# Patient Record
Sex: Female | Born: 1970 | Race: White | Hispanic: No | Marital: Married | Smoking: Never smoker
Health system: Southern US, Community
[De-identification: ages and names within clinical notes are randomized; demographics above are authoritative.]

## PROBLEM LIST (undated history)

## (undated) DIAGNOSIS — E669 Obesity, unspecified: Secondary | ICD-10-CM

## (undated) DIAGNOSIS — K219 Gastro-esophageal reflux disease without esophagitis: Secondary | ICD-10-CM

## (undated) DIAGNOSIS — N393 Stress incontinence (female) (male): Secondary | ICD-10-CM

## (undated) DIAGNOSIS — G510 Bell's palsy: Secondary | ICD-10-CM

## (undated) DIAGNOSIS — F410 Panic disorder [episodic paroxysmal anxiety] without agoraphobia: Secondary | ICD-10-CM

## (undated) DIAGNOSIS — K625 Hemorrhage of anus and rectum: Secondary | ICD-10-CM

## (undated) DIAGNOSIS — B370 Candidal stomatitis: Secondary | ICD-10-CM

## (undated) HISTORY — DX: Bell's palsy: G51.0

## (undated) HISTORY — DX: Gastro-esophageal reflux disease without esophagitis: K21.9

## (undated) HISTORY — DX: Obesity, unspecified: E66.9

## (undated) HISTORY — DX: Hemorrhage of anus and rectum: K62.5

## (undated) HISTORY — DX: Stress incontinence (female) (male): N39.3

## (undated) HISTORY — DX: Panic disorder (episodic paroxysmal anxiety): F41.0

## (undated) HISTORY — DX: Candidal stomatitis: B37.0

---

## 2006-10-12 ENCOUNTER — Inpatient Hospital Stay (HOSPITAL_COMMUNITY): Admission: RE | Admit: 2006-10-12 | Discharge: 2006-10-15 | Payer: Self-pay | Admitting: Obstetrics and Gynecology

## 2007-11-23 LAB — CONVERTED CEMR LAB: Pap Smear: NORMAL

## 2008-04-09 ENCOUNTER — Ambulatory Visit: Payer: Self-pay | Admitting: *Deleted

## 2008-04-09 DIAGNOSIS — E669 Obesity, unspecified: Secondary | ICD-10-CM | POA: Insufficient documentation

## 2008-04-09 DIAGNOSIS — K219 Gastro-esophageal reflux disease without esophagitis: Secondary | ICD-10-CM | POA: Insufficient documentation

## 2008-04-09 DIAGNOSIS — R32 Unspecified urinary incontinence: Secondary | ICD-10-CM | POA: Insufficient documentation

## 2008-04-09 DIAGNOSIS — J301 Allergic rhinitis due to pollen: Secondary | ICD-10-CM

## 2008-04-09 LAB — CONVERTED CEMR LAB
ALT: 24 units/L (ref 0–35)
Alkaline Phosphatase: 83 units/L (ref 39–117)
BUN: 12 mg/dL (ref 6–23)
Basophils Absolute: 0 10*3/uL (ref 0.0–0.1)
Basophils Relative: 0 % (ref 0.0–3.0)
CO2: 24 meq/L (ref 19–32)
Calcium: 9.4 mg/dL (ref 8.4–10.5)
Chloride: 105 meq/L (ref 96–112)
Creatinine, Ser: 0.6 mg/dL (ref 0.4–1.2)
Eosinophils Absolute: 0.1 10*3/uL (ref 0.0–0.7)
Eosinophils Relative: 0.9 % (ref 0.0–5.0)
Glucose, Bld: 94 mg/dL (ref 70–99)
HCT: 40.4 % (ref 36.0–46.0)
Hemoglobin: 13.9 g/dL (ref 12.0–15.0)
LDL Cholesterol: 87 mg/dL (ref 0–99)
Lymphocytes Relative: 27.5 % (ref 12.0–46.0)
MCHC: 34.4 g/dL (ref 30.0–36.0)
Neutro Abs: 3.6 10*3/uL (ref 1.4–7.7)
Neutrophils Relative %: 62.3 % (ref 43.0–77.0)
Potassium: 4.2 meq/L (ref 3.5–5.1)
RDW: 11.9 % (ref 11.5–14.6)
Sodium: 138 meq/L (ref 135–145)
TSH: 1.59 microintl units/mL (ref 0.35–5.50)
Triglycerides: 137 mg/dL (ref 0–149)

## 2008-05-30 ENCOUNTER — Ambulatory Visit: Payer: Self-pay | Admitting: *Deleted

## 2008-07-24 ENCOUNTER — Ambulatory Visit: Payer: Self-pay | Admitting: Family Medicine

## 2008-07-24 DIAGNOSIS — J209 Acute bronchitis, unspecified: Secondary | ICD-10-CM

## 2008-12-21 ENCOUNTER — Ambulatory Visit (HOSPITAL_BASED_OUTPATIENT_CLINIC_OR_DEPARTMENT_OTHER): Admission: RE | Admit: 2008-12-21 | Discharge: 2008-12-21 | Payer: Self-pay | Admitting: Internal Medicine

## 2008-12-21 ENCOUNTER — Telehealth: Payer: Self-pay | Admitting: Family

## 2008-12-21 ENCOUNTER — Ambulatory Visit: Payer: Self-pay | Admitting: Radiology

## 2008-12-21 ENCOUNTER — Ambulatory Visit: Payer: Self-pay | Admitting: Family

## 2008-12-21 DIAGNOSIS — E860 Dehydration: Secondary | ICD-10-CM

## 2008-12-21 DIAGNOSIS — J189 Pneumonia, unspecified organism: Secondary | ICD-10-CM

## 2008-12-21 DIAGNOSIS — B9789 Other viral agents as the cause of diseases classified elsewhere: Secondary | ICD-10-CM

## 2008-12-21 LAB — CONVERTED CEMR LAB
BUN: 10 mg/dL (ref 6–23)
CO2: 23 meq/L (ref 19–32)
Calcium: 9 mg/dL (ref 8.4–10.5)
Chloride: 101 meq/L (ref 96–112)
Creatinine, Ser: 0.87 mg/dL (ref 0.40–1.20)
Glucose, Bld: 113 mg/dL — ABNORMAL HIGH (ref 70–99)
Potassium: 4 meq/L (ref 3.5–5.3)
Sodium: 135 meq/L (ref 135–145)

## 2008-12-22 ENCOUNTER — Telehealth: Payer: Self-pay | Admitting: Family Medicine

## 2008-12-24 ENCOUNTER — Telehealth: Payer: Self-pay | Admitting: Family

## 2008-12-27 ENCOUNTER — Telehealth: Payer: Self-pay | Admitting: Family

## 2009-01-03 ENCOUNTER — Emergency Department (HOSPITAL_BASED_OUTPATIENT_CLINIC_OR_DEPARTMENT_OTHER): Admission: EM | Admit: 2009-01-03 | Discharge: 2009-01-03 | Payer: Self-pay | Admitting: Emergency Medicine

## 2009-01-07 ENCOUNTER — Ambulatory Visit: Payer: Self-pay | Admitting: Family

## 2009-01-07 DIAGNOSIS — F410 Panic disorder [episodic paroxysmal anxiety] without agoraphobia: Secondary | ICD-10-CM

## 2009-01-07 DIAGNOSIS — B370 Candidal stomatitis: Secondary | ICD-10-CM | POA: Insufficient documentation

## 2009-01-07 DIAGNOSIS — G510 Bell's palsy: Secondary | ICD-10-CM

## 2009-12-31 ENCOUNTER — Encounter (INDEPENDENT_AMBULATORY_CARE_PROVIDER_SITE_OTHER): Payer: Self-pay | Admitting: *Deleted

## 2010-02-27 DIAGNOSIS — K625 Hemorrhage of anus and rectum: Secondary | ICD-10-CM | POA: Insufficient documentation

## 2010-03-03 ENCOUNTER — Ambulatory Visit
Admission: RE | Admit: 2010-03-03 | Discharge: 2010-03-03 | Payer: Self-pay | Source: Home / Self Care | Attending: Internal Medicine | Admitting: Internal Medicine

## 2010-03-26 NOTE — Letter (Signed)
Summary: New Patient letter  [facility]  [address] [place], [place] [address]   Phone: [phone]  Fax: [phone]       12/31/2009 MRN: [id number]  Jocelyn Wyatt [address] [place], [place]  [address]  Dear Jocelyn Wyatt,  Welcome to the Gastroenterology Division at [facility].    You are scheduled to see Dr. Juanda Chance on 03/03/2010 at 1:30PM on the 3rd floor at [facility] [address].  We ask that you try to arrive at our office 15 minutes prior to your appointment time to allow for check-in.  We would like you to complete the enclosed self-administered evaluation form prior to your visit and bring it with you on the day of your appointment.  We will review it with you.  Also, please bring a complete list of all your medications or, if you prefer, bring the medication bottles and we will list them.  Please bring your insurance card so that we may make a copy of it.  If your insurance requires a referral to see a specialist, please bring your referral form from your primary care physician.  Co-payments are due at the time of your visit and may be paid by cash, check or credit card.     Your office visit will consist of a consult with your physician (includes a physical exam), any laboratory testing he/she may order, scheduling of any necessary diagnostic testing (e.g. x-ray, ultrasound, CT-scan), and scheduling of a procedure (e.g. Endoscopy, Colonoscopy) if required.  Please allow enough time on your schedule to allow for any/all of these possibilities.    If you cannot keep your appointment, please call [phone] to cancel or reschedule prior to your appointment date.  This allows [place] the opportunity to schedule an appointment for another patient in need of care.  If you do not cancel or reschedule by 5 p.m. the business day prior to your appointment date, you will be charged a $50.00 late cancellation/no-show fee.    Thank you for choosing  [facility] for your medical needs.  We appreciate the opportunity to care for you.  Please visit [place] at our website  to learn more about our practice.                     Sincerely,                                                             The Gastroenterology Division

## 2010-03-27 NOTE — Assessment & Plan Note (Signed)
Summary: RECTAL BLEEDING...AS.   History of Present Illness Visit Type: new patient  Primary GI MD: Lina Sar MD Primary Provider: Sandford Craze, FNP Requesting Provider: na Chief Complaint: BRB when patient wipes after BMs, bloating, and rectal pain  History of Present Illness:   This is a 40 year old white female with hematochezia occurring almost daily. There is a small amount of bright red blood associated with rectal burning, pain and itching. Symptoms have been present for a couple of months. She has normal bowel habits, having a  bowel movement daily. There is no abdominal pain. Her paternal uncle had colon cancer. Other medical problems include being overweight and questionable alcohol use.   GI Review of Systems    Reports bloating.      Denies abdominal pain, acid reflux, belching, chest pain, dysphagia with liquids, dysphagia with solids, heartburn, loss of appetite, nausea, vomiting, vomiting blood, weight loss, and  weight gain.      Reports rectal bleeding and  rectal pain.     Denies anal fissure, black tarry stools, change in bowel habit, constipation, diarrhea, diverticulosis, fecal incontinence, heme positive stool, hemorrhoids, irritable bowel syndrome, jaundice, light color stool, and  liver problems.    Current Medications (verified): 1)  None  Allergies (verified): No Known Drug Allergies  Past History:  Past Medical History:  very mild stress INCONTINENCE ALLERGIC RHINITIS, SEASONAL (ICD-477.0) CHICKENPOX (ICD-052.9) GERD (ICD-530.81) first  pregnancy -patient had brief problem with hypothyroidism, no meds given - recheck a month later WNL obesity sees gyn  Past Surgical History: Reviewed history from 04/09/2008 and no changes required. 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  The patient denies allergy/sinus, anemia, anxiety-new, arthritis/joint pain, back pain, blood in urine, breast changes/lumps, change in vision, confusion, cough, coughing up blood, depression-new, fainting, fatigue, fever, headaches-new, hearing problems, heart murmur, heart rhythm changes, menstrual pain, muscle pains/cramps, night sweats, nosebleeds, pregnancy symptoms, shortness of breath, skin rash, sleeping problems, sore throat, swelling of feet/legs, swollen lymph glands, thirst - excessive , urination - excessive , urination changes/pain, urine leakage, vision changes, and voice change.         Pertinent positive and negative review of systems were noted in the above HPI. All other ROS was otherwise negative.   Vital Signs:  Patient profile:   40 year old female Menstrual status:  regular Height:      61 inches Weight:      194 pounds BMI:     36.79 BSA:     1.87 Pulse rate:   60 / minute Pulse rhythm:   regular BP sitting:   120 / 74  (left arm) Cuff size:   regular  Vitals Entered By: Ok Anis CMA (March 03, 2010 1:48 PM)  Physical Exam  General:  Well developed, well nourished, no acute distress. Eyes:  PERRLA, no icterus. Mouth:  No deformity or lesions, dentition normal. Neck:  Supple; no masses or thyromegaly. Lungs:  Clear throughout to auscultation. Heart:  Regular rate and rhythm; no murmurs, rubs,  or bruits. Abdomen:  Soft, nontender and  nondistended. No masses, hepatosplenomegaly or hernias noted. Normal bowel sounds. Rectal:  rectal and anoscopic exam reveals normal perianal area. I can not appreciate any dermatitis or leakage. Rectal sphincter tone appears normal. Anoscope advanced into the rectal ampulla without  discomfort. There was no evidence of bleeding. There are small circumferential hemorrhoids which did not prolapse and there was no evidence of bleeding or thrombosis. Small amount of stool was Hemoccult negative. Extremities:  No clubbing, cyanosis, edema or deformities noted. Skin:  Intact without significant lesions or rashes. Psych:  Alert and cooperative. Normal mood and affect.   Impression & Recommendations:  Problem # 1:  RECTAL BLEEDING (ICD-569.3)  Patient has had painful rectal bleeding in small volumes almost certainly arising from the anal canal or rectal ampulla. She has small first grade hemorrhoids but no evidence of fissure. Her symptoms are out of proportion to  today's findings. Since her stool is Hemoccult negative, Iwe will proceed with conservative treatment using Anusol-HC suppositories and Analpram cream 2.5%  for 10 days. We will then obtain stool Hemoccult cards and if these are positive, she will need a colonoscopy.  Orders: [facility] Hemoccult Cards #3 (take home) (Hem cards #3)  Problem # 2:  CANDIDIASIS, ORAL (ICD-112.0) There is no evidence of a perirectal yeast infection.  Problem # 3:  SPECIAL SCREENING FOR MALIGNANT NEOPLASMS COLON (ICD-V76.51) This will start at age 58. There is a history of colon cancer in an indirect relative. If her bleeding continues despite conservative therapy, I would consider a colonoscopy before age 16.  Patient Instructions: 1)  Please pick up your prescriptions at the pharmacy. Electronic prescription(s) has already been sent ofr Anusol HC suppositories, 1 suppository per rectum at bedtime x 12 nights. We have also sent her Analpram Cream two times a day to the rectum. 2)  We have given you hemoccult cards to complete after your treatment with Ansuol and Analpram. 3)  We will discuss any need for colonoscopy after your treatment with suppositories and cream as well as after you have completed your Hemoccult cards. 4)   Copy sent  to : Sandford Craze, FNP 5)  The medication list was reviewed and reconciled.  All changed / newly prescribed medications were explained.  A complete medication list was provided to the patient / caregiver. Prescriptions: ANALPRAM-HC 1-2.5 % CREA (HYDROCORTISONE ACE-PRAMOXINE) Apply to rectum two times a day  #15 grams x 0   Entered by:   Lamona Curl CMA (AAMA)   Authorized by:   Hart Carwin MD   Signed by:   Lamona Curl CMA (AAMA) on 03/03/2010   Method used:   [facility] to        [facility] [phone][facility] (retail)       [address]       [place], [place]  [address]       Ph: [phone]       Fax: [phone]   RxID:   [id number] ANUSOL-HC 25 MG SUPP (HYDROCORTISONE ACETATE) Insert 1 suppository into rectum at bedtime x 12 nights  #12 x 0   Entered by:   Lamona Curl CMA (AAMA)   Authorized by:   Hart Carwin MD   Signed by:   Lamona Curl CMA (AAMA) on 03/03/2010   Method used:   [facility]ron[facility] to        [facility] [phone]* (retail)       [address]  [place], [place]  [address]       Ph: [phone]       Fax: [phone]   RxID:   [id number]

## 2010-04-18 ENCOUNTER — Telehealth: Payer: Self-pay | Admitting: Internal Medicine

## 2010-04-21 ENCOUNTER — Encounter: Payer: Self-pay | Admitting: Internal Medicine

## 2010-05-01 NOTE — Letter (Signed)
Summary: Pre Visit Letter Revised  [facility]  [address] [place], [place] [address]   Phone: [phone]  Fax: [phone]        04/21/2010 MRN: [id number] Jocelyn Wyatt [address] [place], [place]  [address]             Procedure Date:  05/22/10   Welcome to the Gastroenterology Division at [facility].    You are scheduled to see a nurse for your pre-procedure visit on 05/13/10 at 8:30 a.m. on the 3rd floor at [facility], [address].  We ask that you try to arrive at our office 15 minutes prior to your appointment time to allow for check-in.  Please take a minute to review the attached form.  If you answer "Yes" to one or more of the questions on the first page, we ask that you call the person listed at your earliest opportunity.  If you answer "No" to all of the questions, please complete the rest of the form and bring it to your appointment.    Your nurse visit will consist of discussing your medical and surgical history, your immediate family medical history, and your medications.   If you are unable to list all of your medications on the form, please bring the medication bottles to your appointment and we will list them.  We will need to be aware of both prescribed and over the counter drugs.  We will need to know exact dosage information as well.    Please be prepared to read and sign documents such as consent forms, a financial agreement, and acknowledgement forms.  If necessary, and with your consent, a friend or relative is welcome to sit-in on the nurse visit with you.  Please bring your insurance card so that we may make a copy of it.  If your insurance requires a referral to see a specialist, please bring your referral form from your primary care physician.  No co-pay is required for this nurse visit.     If you cannot keep your appointment, please call [phone] to cancel or reschedule prior to your appointment date.  This  allows [place] the opportunity to schedule an appointment for another patient in need of care.    Thank you for choosing [facility] for your medical needs.  We appreciate the opportunity to care for you.  Please visit [place] at our website  to learn more about our practice.  Sincerely, The Gastroenterology Division

## 2010-05-01 NOTE — Progress Notes (Signed)
Summary: returning your call  Phone Note Call from Patient Call back at Home Phone [phone]   Caller: Patient Call For: Dr Juanda Chance Reason for Call: Talk to Nurse Summary of Call: Patient states that she's returning Dottie's call. Initial call taken by: Tawni Levy,  April 18, 2010 2:22 PM  Follow-up for Phone Call        Called patient to ask that she return Hemoccult cards as requested at her office visit 03/03/10. Patient states that she has not done so because she continues to have the same symptoms as when she was in the office. She states that she used analpram and anusol as prescribed and she continues with small amounts BRB when wiping as well as abdominal bloating. Per Dr Regino Schultze last office visit, "if bleeding continues, we will consider colonoscopy before age 5." Dr Juanda Chance, please advise. Follow-up by: Lamona Curl CMA Duncan Dull),  April 18, 2010 2:28 PM  Additional Follow-up for Phone Call Additional follow up Details #1::        I agree I saw her on 1/9//2012, still bleeding. , no relieve with topical Rx. I favor colonoscopy. Please call pt to discuss colonoscopy for further evaluation of bleeding. Additional Follow-up by: Hart Carwin MD,  April 18, 2010 11:37 PM    Additional Follow-up for Phone Call Additional follow up Details #2::    Called "call back" number on triage. Patient apparently not working today. I have called home number, no answer. I have left a message for the patient to call back. Dottie Nelson-Smith CMA Duncan Dull)  April 21, 2010 8:23 AM   Patient called back and left home number to contact her. I have returner her call. No answer. I have left a message for the patient to call back. Dottie Nelson-Smith CMA Duncan Dull)  April 21, 2010 11:54 AM   Advised patient that Dr Juanda Chance recommends colonoscopy to further evaluate her bleeding since it was not relieved with topical steroids. Patient has scheduled colonoscopy 05/22/10 and previsit  05/13/10. Dottie Nelson-Smith CMA Duncan Dull)  April 21, 2010 12:09 PM    [Prescriptions]

## 2010-05-21 ENCOUNTER — Encounter (INDEPENDENT_AMBULATORY_CARE_PROVIDER_SITE_OTHER): Payer: Self-pay | Admitting: *Deleted

## 2010-05-22 ENCOUNTER — Other Ambulatory Visit: Payer: Self-pay | Admitting: Internal Medicine

## 2010-05-27 NOTE — Letter (Signed)
Summary: Pre Visit Letter Revised  [facility]  [address] [place], [place] [address]   Phone: [phone]  Fax: [phone]        05/21/2010 MRN: [id number] Jocelyn Wyatt [address] [place], [place]  [address]             Procedure Date:  07-01-10           Direct Colon----Dr. Juanda Chance   Welcome to the Gastroenterology Division at [facility].    You are scheduled to see a nurse for your pre-procedure visit on 06-17-10 at 8:30a.m. on the 3rd floor at [facility], [address].  We ask that you try to arrive at our office 15 minutes prior to your appointment time to allow for check-in.  Please take a minute to review the attached form.  If you answer "Yes" to one or more of the questions on the first page, we ask that you call the person listed at your earliest opportunity.  If you answer "No" to all of the questions, please complete the rest of the form and bring it to your appointment.    Your nurse visit will consist of discussing your medical and surgical history, your immediate family medical history, and your medications.   If you are unable to list all of your medications on the form, please bring the medication bottles to your appointment and we will list them.  We will need to be aware of both prescribed and over the counter drugs.  We will need to know exact dosage information as well.    Please be prepared to read and sign documents such as consent forms, a financial agreement, and acknowledgement forms.  If necessary, and with your consent, a friend or relative is welcome to sit-in on the nurse visit with you.  Please bring your insurance card so that we may make a copy of it.  If your insurance requires a referral to see a specialist, please bring your referral form from your primary care physician.  No co-pay is required for this nurse visit.     If you cannot keep your appointment, please call [phone] to cancel or reschedule prior  to your appointment date.  This allows [place] the opportunity to schedule an appointment for another patient in need of care.    Thank you for choosing [facility] for your medical needs.  We appreciate the opportunity to care for you.  Please visit [place] at our website  to learn more about our practice.  Sincerely, The Gastroenterology Division

## 2010-06-17 ENCOUNTER — Ambulatory Visit (AMBULATORY_SURGERY_CENTER): Payer: BC Managed Care – PPO

## 2010-06-17 ENCOUNTER — Encounter: Payer: Self-pay | Admitting: Internal Medicine

## 2010-06-17 VITALS — Ht 61.0 in | Wt 199.6 lb

## 2010-06-17 DIAGNOSIS — K625 Hemorrhage of anus and rectum: Secondary | ICD-10-CM

## 2010-06-17 MED ORDER — PEG-KCL-NACL-NASULF-NA ASC-C 100 G PO SOLR: 1.0000 | Freq: Once | ORAL | Status: AC

## 2010-06-30 ENCOUNTER — Encounter: Payer: Self-pay | Admitting: Internal Medicine

## 2010-07-01 ENCOUNTER — Encounter: Payer: Self-pay | Admitting: Internal Medicine

## 2010-07-01 ENCOUNTER — Ambulatory Visit (AMBULATORY_SURGERY_CENTER): Payer: 59 | Admitting: Internal Medicine

## 2010-07-01 ENCOUNTER — Encounter: Payer: Self-pay | Admitting: *Deleted

## 2010-07-01 VITALS — BP 107/71 | HR 94 | Temp 98.2°F | Resp 35 | Ht 61.0 in | Wt 199.0 lb

## 2010-07-01 DIAGNOSIS — K921 Melena: Secondary | ICD-10-CM

## 2010-07-01 DIAGNOSIS — K625 Hemorrhage of anus and rectum: Secondary | ICD-10-CM

## 2010-07-01 DIAGNOSIS — K6289 Other specified diseases of anus and rectum: Secondary | ICD-10-CM

## 2010-07-01 MED ORDER — HYDROCORTISONE ACE-PRAMOXINE 2.5-1 % RE CREA: TOPICAL_CREAM | Freq: Three times a day (TID) | RECTAL | Status: AC

## 2010-07-01 MED ORDER — SODIUM CHLORIDE 0.9 % IV SOLN: 500.0000 mL | INTRAVENOUS | Status: DC

## 2010-07-01 NOTE — Patient Instructions (Signed)
Normal colonoscopy  Analpram cream 2.5% with applicator to apply into the anal canal at bedtime.  [facility]  Dr. Regino Schultze nurse on the 3rd floor will call to schedule office visit to be seen in 4 weeks.

## 2010-07-01 NOTE — Progress Notes (Signed)
Attempted #24 angiocath in right hand.  Brisk blood return flushed with normal saline and swelling occurred above site of insertion.  Dc'd and pressure applied.  Attempted #24 angiocath in Right arm. Again brisk blood return and flushed with normal saline and swelling occurred above site of insertion.  dc'd and pressure applied to site.  Small purple bruising noted.  Dixie Doss RN will attempt IV.

## 2010-07-02 ENCOUNTER — Telehealth: Payer: Self-pay | Admitting: *Deleted

## 2010-07-02 NOTE — Telephone Encounter (Signed)
Message left for patient on home telephone to call with any questions or concerns.

## 2010-07-08 NOTE — Op Note (Signed)
Jocelyn Wyatt, Jocelyn Wyatt              ACCOUNT NO.:  [id number]   MEDICAL RECORD NO.:  [id number]          PATIENT TYPE:  INP   LOCATION:  9135                          FACILITY:  [facility]   PHYSICIAN:  Kendra H. Tenny Craw, MD     DATE OF BIRTH:  May 07, 1970   DATE OF PROCEDURE:  10/12/2006  DATE OF DISCHARGE:                               OPERATIVE REPORT   ATTENDING PHYSICIAN:  Kendra H. Tenny Craw, M.D.   PREOPERATIVE DIAGNOSIS:  1. 39-week intrauterine pregnancy.  2. History of prior cesarean section, declines trial of labor.   POSTOPERATIVE DIAGNOSIS:  1. 39-week intrauterine pregnancy.  2. History of prior cesarean section, declines trial of labor.   PROCEDURE:  Repeat low transverse cesarean section via Pfannenstiel's  skin incision.   SURGEON:  Freddrick March. Tenny Craw, M.D.   ASSISTANT:  Ilda Mori, M.D.   ANESTHESIA:  Spinal   OPERATIVE FINDINGS:  Vigorous female infant in the vertex presentation  weighing 8 pounds 4 ounces with Apgar scores of 9 and 10. Normal  appearing ovaries, tubes and uterus.   SPECIMEN:  Placenta for disposal.   ESTIMATED BLOOD LOSS:  600 mL.   COMPLICATIONS:  None.   PROCEDURE:  The patient is a 40 year old G2 P1 at 33 weeks and 0 days  estimated gestational age who presents today for repeat low transverse  cesarean section.  Following the appropriate informed consent she was  brought to the operating room where spinal anesthesia was administered  and found to be adequate.  She was placed in dorsal supine position with  a leftward tilt, prepped and draped in normal sterile fashion.  Scalpel  was used to make a Pfannenstiel's skin incision and was taken to the  underlying layers of soft tissue to the fascia.  Fascia was incised  midline. Fascial incision was extended laterally with Mayo scissors.  Superior aspect of fascial incision was grasped with Kocher clamps x2,  tented up underlying rectus muscle was dissected off sharply with the  electrocautery unit.  Same procedure was repeated on the inferior aspect  of the fascial incision. Rectus muscles were identified, separated in  the midline.  The abdominal peritoneum was identified, tented up,  entered sharply and the incision was extended superiorly and inferiorly  with good visualization of the bladder and the bladder blade was then  inserted. The vesicouterine peritoneum was identified, tented up and  entered sharply with the Metzenbaum scissors and the incision was  extended laterally with the Metzenbaum scissors and the bladder flap was  created digitally with blunt dissection.  The scalpel was then used to  make a low transverse incision on the uterus which was then carried  laterally with blunt dissection.  The uterine was explored.  The vertex  was identified and brought up to the uterine incision.  The infant's  head was delivered with the assistance of a mushroom vacuum with pop-off  x1. Following delivery of the infant, the infant was noted to cry  vigorously on the operative field.  The infant was bulb suctioned on the  operative field.  Cord was clamped and cut  and infant was passed to the  waiting pediatricians.  The placenta was then spontaneously delivered.  Uterus was exteriorized, cleared of all clot and debris.  Uterine  incision was repaired with #1 chromic in a running locked fashion and a  second imbricating layer for hemostasis was performed.  The uterus was  returned to the abdominal cavity.  The abdominal cavity was cleared of  all clot and debris.  The uterine incision was then reinspected and  found again confirmed to be hemostatic. The abdominal peritoneum was  then reapproximated with 2-0 Vicryl.  The fascia was closed with a  looped PDS and the skin was closed with staples.  All sponge, lap,  needle counts were correct x2.  The patient tolerated the procedure well  and was brought to the recovery room in stable condition following the  procedure.      Freddrick March. Tenny Craw, MD  Electronically Signed     KHR/MEDQ  D:  10/12/2006  T:  10/12/2006  Job:  [id number]

## 2010-07-08 NOTE — Discharge Summary (Signed)
[name], Jocelyn Wyatt NO.:  [id number]   MEDICAL RECORD NO.:  [id number]          PATIENT TYPE:  INP   LOCATION:  9135                          [facility]ACILITY:  [facility]   PHYSICIAN:  Gerrit Friends. Aldona Bar, M.D.   DATE OF BIRTH:  09-24-1970   DATE OF ADMISSION:  10/12/2006  DATE OF DISCHARGE:  10/15/2006                               DISCHARGE SUMMARY   DISCHARGE DIAGNOSIS:  1. Term pregnancy, delivered 8 pounds 4 ounces female infant, Apgars 9      and 10.  2. Blood type O+.  3. Previous cesarean section.   PROCEDURES:  Repeat low-transverse cesarean section.   SUMMARY:  This patient is a 40 year old gravida 2, para 1 who had  previous cesarean section in 2007 elsewhere after a failed induction  attempt was followed by [place] during her pregnancy and did well.  She  transferred in to our practice at about 7 months gestation.  She  requested a repeat cesarean section at term and was scheduled  accordingly and on August 19, was delivered of an 8 pounds 4 ounces female  infant by repeat cesarean section by Dr. Waynard Reeds.  Postpartum course  was benign.  Her discharge hemoglobin was 10.9 with a white count of  9700, platelet count of 149,000.  On the morning of August 22, she was  ambulating well, tolerating a regular diet well, having normal bowel and  bladder function, and was afebrile.  Her wound was clean and dry.  Fundus was firm.  She was breast-feeding and bottle-feeding and very  desirous of discharge.  Accordingly her staples were removed and wound  was Steri-Stripped with Benzoin and she was given all appropriate  instructions and prescription for Motrin 600 mg use every 6 hours as  needed.  In addition she will continue her vitamins - one a day as long  she is breast-feeding.  She will return to the office follow-up in  approximately four weeks' time.   CONDITION ON DISCHARGE:  Improved.      Gerrit Friends. Aldona Bar, M.D.  Electronically Signed     RMW/MEDQ  D:  10/15/2006   T:  10/15/2006  Job:  [id number]

## 2010-08-05 ENCOUNTER — Ambulatory Visit: Payer: 59 | Admitting: Internal Medicine

## 2010-10-24 ENCOUNTER — Encounter: Payer: Self-pay | Admitting: Family

## 2010-10-24 ENCOUNTER — Ambulatory Visit: Payer: 59 | Admitting: Internal Medicine

## 2010-10-24 ENCOUNTER — Ambulatory Visit (INDEPENDENT_AMBULATORY_CARE_PROVIDER_SITE_OTHER): Payer: 59 | Admitting: Family

## 2010-10-24 DIAGNOSIS — N390 Urinary tract infection, site not specified: Secondary | ICD-10-CM

## 2010-10-24 DIAGNOSIS — F329 Major depressive disorder, single episode, unspecified: Secondary | ICD-10-CM | POA: Insufficient documentation

## 2010-10-24 DIAGNOSIS — R300 Dysuria: Secondary | ICD-10-CM

## 2010-10-24 DIAGNOSIS — F341 Dysthymic disorder: Secondary | ICD-10-CM

## 2010-10-24 LAB — POCT URINALYSIS DIPSTICK: pH, UA: 5

## 2010-10-24 MED ORDER — CIPROFLOXACIN HCL 500 MG PO TABS: 500.0000 mg | ORAL_TABLET | Freq: Two times a day (BID) | ORAL | Status: AC

## 2010-10-24 NOTE — Assessment & Plan Note (Signed)
We discussed stress of sending her oldest child to school for the first time.  I reassured her that her emotions are normal and should improve over the next several weeks.  She was instructed however to let us know if her symptoms worsen or if they do not continue to improve.

## 2010-10-24 NOTE — Patient Instructions (Signed)
Please call if your symptoms worsen or if you are not feeling better in 2-3 days.  

## 2010-10-24 NOTE — Assessment & Plan Note (Signed)
Dip + leuks and moderate blood. Will treat with cipro.  Send urine for culture.

## 2010-10-24 NOTE — Progress Notes (Signed)
  Subjective:    Patient ID: NIHIRA PUELLO, female    DOB: 09/22/1970, 40 y.o.   MRN: [id number]  HPI  Ms.  Kott is a 40 yr old female who presents today with chief complaint of dysuria.  Symptoms started 2 days ago. Notes associated  blood tinged urine.  + mild  low back pain (middle of her back).  Denies history of fever or frequent UTI's.  Notes foul odor to her urine.  Has not tried any OTC preps for dysuria.     She tells me that her oldest child started kindergarten this week.  She has felt especially emotional about this and wonders if her feelings are normal.    Review of Systems See HPI  Past Medical History  Diagnosis Date  . Rectal bleeding   . Stress incontinence, female   . GERD (gastroesophageal reflux disease)   . Obesity   . Panic attack   . Oral candidiasis   . Bell's palsy     left    History   Social History  . Marital Status: Married    Spouse Name: N/A    Number of Children: N/A  . Years of Education: N/A   Occupational History  . Not on file.   Social History Main Topics  . Smoking status: Never Smoker   . Smokeless tobacco: Never Used  . Alcohol Use: No  . Drug Use: No  . Sexually Active: Not on file   Other Topics Concern  . Not on file   Social History Narrative  . No narrative on file    Past Surgical History  Procedure Date  . Cesarean section     2 x    Family History  Problem Relation Age of Onset  . Colon cancer Paternal Aunt   . Breast cancer Cousin     x 2  . Lymphoma    . Diabetes      No Known Allergies  No current outpatient prescriptions on file prior to visit.   Current Facility-Administered Medications on File Prior to Visit  Medication Dose Route Frequency Provider Last Rate Last Dose  . 0.9 %  sodium chloride infusion  500 mL Intravenous Continuous Hart Carwin, MD        BP 114/72  Pulse 90  Temp(Src) 98.2 F (36.8 C) (Oral)  Resp 16  Ht 5' 0.98" (1.549 m)  Wt 191 lb 1.9 oz (86.691 kg)  BMI  36.13 kg/m2  LMP 10/03/2010       Objective:   Physical Exam  Constitutional: She appears well-developed and well-nourished.  Cardiovascular: Normal rate and regular rhythm.   No murmur heard. Pulmonary/Chest: Effort normal and breath sounds normal. No respiratory distress. She has no wheezes.  Abdominal: Soft. Bowel sounds are normal.       Mild suprapubic tenderness to palpation without abdominal distension, guarding or rebound tenderness.    Psychiatric:       Mildly tearful upon discussion of her child attending kindergarten this week for the first time.           Assessment & Plan:

## 2010-10-25 LAB — URINE CULTURE: Colony Count: NO GROWTH

## 2010-10-27 ENCOUNTER — Telehealth: Payer: Self-pay | Admitting: Family

## 2010-10-27 NOTE — Telephone Encounter (Signed)
Please call patient and let her know that urine culture is negative.  She can stop cipro.  She should let us know if her symptoms return.

## 2010-10-28 NOTE — Telephone Encounter (Signed)
Left message on machine to return my call. 

## 2010-10-29 NOTE — Telephone Encounter (Signed)
Left message on machine to return my call. 

## 2010-10-29 NOTE — Telephone Encounter (Signed)
Pt.notified

## 2010-12-05 ENCOUNTER — Other Ambulatory Visit: Payer: Self-pay | Admitting: Pediatrics

## 2010-12-05 ENCOUNTER — Ambulatory Visit: Admission: RE | Admit: 2010-12-05 | Payer: 59 | Source: Ambulatory Visit

## 2010-12-05 DIAGNOSIS — R05 Cough: Secondary | ICD-10-CM

## 2010-12-05 LAB — CBC
HCT: 31.7 — ABNORMAL LOW
Hemoglobin: 10.9 — ABNORMAL LOW
MCHC: 33.8
MCHC: 34.3
MCV: 79.8
Platelets: 170
RBC: 3.95
RBC: 4.7
RDW: 15.6 — ABNORMAL HIGH
RDW: 15.6 — ABNORMAL HIGH
WBC: 8

## 2010-12-05 LAB — TYPE AND SCREEN

## 2011-04-20 ENCOUNTER — Encounter: Payer: Self-pay | Admitting: Family

## 2011-04-20 ENCOUNTER — Ambulatory Visit (INDEPENDENT_AMBULATORY_CARE_PROVIDER_SITE_OTHER): Payer: 59 | Admitting: Family

## 2011-04-20 VITALS — BP 122/84 | HR 103 | Temp 98.2°F | Resp 16 | Wt 193.0 lb

## 2011-04-20 DIAGNOSIS — L659 Nonscarring hair loss, unspecified: Secondary | ICD-10-CM

## 2011-04-20 DIAGNOSIS — J329 Chronic sinusitis, unspecified: Secondary | ICD-10-CM

## 2011-04-20 LAB — TSH: TSH: 3.238 u[IU]/mL (ref 0.350–4.500)

## 2011-04-20 MED ORDER — AMOXICILLIN-POT CLAVULANATE 875-125 MG PO TABS: 1.0000 | ORAL_TABLET | Freq: Two times a day (BID) | ORAL | Status: AC

## 2011-04-20 NOTE — Patient Instructions (Signed)

## 2011-04-20 NOTE — Progress Notes (Signed)
  Subjective:    Patient ID: Jocelyn Wyatt, female    DOB: July 28, 1970, 41 y.o.   MRN: [id number]  HPI  Ms.  Wyatt is a 41 yr old female who presents today with cheif complaint of sinus drainage.  Notes that she has used allegra-D which helped the sinus pressure.  Symptoms are accompanied by yellow/green nasal discharge.  Feels "winded" and tired, mild cough, no fever.  Symptoms started 1 month ago.   Hair loss/fatigue-  Wants thyroid checked.  Notes that her grandmother had "thin hair."   Review of Systems    see HPI  Past Medical History  Diagnosis Date  . Rectal bleeding   . Stress incontinence, female   . GERD (gastroesophageal reflux disease)   . Obesity   . Panic attack   . Oral candidiasis   . Bell's palsy     left    History   Social History  . Marital Status: Married    Spouse Name: N/A    Number of Children: N/A  . Years of Education: N/A   Occupational History  . Not on file.   Social History Main Topics  . Smoking status: Never Smoker   . Smokeless tobacco: Never Used  . Alcohol Use: No  . Drug Use: No  . Sexually Active: Not on file   Other Topics Concern  . Not on file   Social History Narrative  . No narrative on file    Past Surgical History  Procedure Date  . Cesarean section     2 x    Family History  Problem Relation Age of Onset  . Colon cancer Paternal Aunt   . Breast cancer Cousin     x 2  . Lymphoma    . Diabetes      No Known Allergies  No current outpatient prescriptions on file prior to visit.   Current Facility-Administered Medications on File Prior to Visit  Medication Dose Route Frequency Provider Last Rate Last Dose  . DISCONTD: 0.9 %  sodium chloride infusion  500 mL Intravenous Continuous Hart Carwin, MD        BP 122/84  Pulse 103  Temp(Src) 98.2 F (36.8 C) (Oral)  Resp 16  Wt 193 lb 0.6 oz (87.562 kg)  SpO2 99%  LMP 03/31/2011    Objective:   Physical Exam  Constitutional: She appears  well-developed and well-nourished. No distress.  HENT:  Head: Normocephalic and atraumatic.  Right Ear: Tympanic membrane and ear canal normal.  Left Ear: Tympanic membrane and ear canal normal.  Mouth/Throat: No oropharyngeal exudate, posterior oropharyngeal edema or posterior oropharyngeal erythema.  Eyes: Pupils are equal, round, and reactive to light. No scleral icterus.  Cardiovascular: Normal rate and regular rhythm.   No murmur heard. Pulmonary/Chest: Effort normal and breath sounds normal. No respiratory distress. She has no wheezes. She has no rales. She exhibits no tenderness.  Musculoskeletal: She exhibits no edema.  Skin:       Thinning hair noted on top of head.   Psychiatric: She has a normal mood and affect. Her behavior is normal. Judgment and thought content normal.          Assessment & Plan:

## 2011-04-20 NOTE — Assessment & Plan Note (Signed)
Will obtain TSH and CBC.

## 2011-04-20 NOTE — Assessment & Plan Note (Signed)
Will plan to treat with Augmentin.  Recommended that she continue allegra D.

## 2011-04-21 LAB — CBC
Hemoglobin: 13.8 g/dL (ref 12.0–15.0)
MCV: 79.9 fL (ref 78.0–100.0)
Platelets: 233 10*3/uL (ref 150–400)
RBC: 5.18 MIL/uL — ABNORMAL HIGH (ref 3.87–5.11)
RDW: 13.2 % (ref 11.5–15.5)

## 2011-04-28 ENCOUNTER — Encounter: Payer: Self-pay | Admitting: Family

## 2011-04-28 DIAGNOSIS — L659 Nonscarring hair loss, unspecified: Secondary | ICD-10-CM

## 2011-04-30 ENCOUNTER — Encounter: Payer: Self-pay | Admitting: Family

## 2011-04-30 NOTE — Telephone Encounter (Signed)
Jocelyn Wyatt, could you pls call pt and let her know that I would like to see her back in the office to re-evaluate her.

## 2011-05-04 ENCOUNTER — Telehealth: Payer: Self-pay | Admitting: Family

## 2011-05-04 ENCOUNTER — Ambulatory Visit (HOSPITAL_BASED_OUTPATIENT_CLINIC_OR_DEPARTMENT_OTHER)
Admission: RE | Admit: 2011-05-04 | Discharge: 2011-05-04 | Disposition: A | Discharge status: Home / Self Care | Payer: 59 | Source: Ambulatory Visit | Attending: Family | Admitting: Family

## 2011-05-04 ENCOUNTER — Ambulatory Visit (INDEPENDENT_AMBULATORY_CARE_PROVIDER_SITE_OTHER): Payer: 59 | Admitting: Family

## 2011-05-04 ENCOUNTER — Encounter: Payer: Self-pay | Admitting: Family

## 2011-05-04 VITALS — BP 100/76 | HR 120 | Temp 98.2°F | Resp 16 | Ht 60.0 in | Wt 188.0 lb

## 2011-05-04 DIAGNOSIS — L659 Nonscarring hair loss, unspecified: Secondary | ICD-10-CM

## 2011-05-04 DIAGNOSIS — J329 Chronic sinusitis, unspecified: Secondary | ICD-10-CM

## 2011-05-04 DIAGNOSIS — J320 Chronic maxillary sinusitis: Secondary | ICD-10-CM | POA: Insufficient documentation

## 2011-05-04 DIAGNOSIS — J3489 Other specified disorders of nose and nasal sinuses: Secondary | ICD-10-CM | POA: Insufficient documentation

## 2011-05-04 MED ORDER — FLUTICASONE PROPIONATE 50 MCG/ACT NA SUSP: 2.0000 | Freq: Every day | NASAL | Status: AC

## 2011-05-04 MED ORDER — LEVOFLOXACIN 750 MG PO TABS: 750.0000 mg | ORAL_TABLET | Freq: Every day | ORAL | Status: AC

## 2011-05-04 NOTE — Telephone Encounter (Signed)
Spoke with Wellsite geologist at [facility] re: CT sinus. Received Prior Auth # [id number].

## 2011-05-04 NOTE — Progress Notes (Signed)
  Subjective:    Patient ID: Jocelyn Wyatt, female    DOB: 09-08-1970, 41 y.o.   MRN: [id number]  HPI  Ms.  Wyatt is a 41 yr old female who presents today for follow up of her sinus infection. She was treated with 10 days of Augmentin last visit.  Symptoms have worsened.  Reports subjective low grade fever.  Reports that she has "a lot of pressure in my head, blew too hard.  Draining gagging. Yellow nasal discharge.    Hair loss- blood work was unremarkable. Pt has been referred to Dermatology for further evaluation.  Review of Systems See HPI  Past Medical History  Diagnosis Date  . Rectal bleeding   . Stress incontinence, female   . GERD (gastroesophageal reflux disease)   . Obesity   . Panic attack   . Oral candidiasis   . Bell's palsy     left    History   Social History  . Marital Status: Married    Spouse Name: N/A    Number of Children: N/A  . Years of Education: N/A   Occupational History  . Not on file.   Social History Main Topics  . Smoking status: Never Smoker   . Smokeless tobacco: Never Used  . Alcohol Use: No  . Drug Use: No  . Sexually Active: Not on file   Other Topics Concern  . Not on file   Social History Narrative  . No narrative on file    Past Surgical History  Procedure Date  . Cesarean section     2 x    Family History  Problem Relation Age of Onset  . Colon cancer Paternal Aunt   . Breast cancer Cousin     x 2  . Lymphoma    . Diabetes      No Known Allergies  No current outpatient prescriptions on file prior to visit.    BP 100/76  Pulse 120  Temp(Src) 98.2 F (36.8 C) (Oral)  Resp 16  Ht 5' (1.524 m)  Wt 188 lb (85.276 kg)  BMI 36.72 kg/m2  SpO2 99%  LMP 04/27/2011       Objective:   Physical Exam  Constitutional: She appears well-developed and well-nourished. No distress.  HENT:  Head: Normocephalic and atraumatic.  Right Ear: Tympanic membrane and ear canal normal.  Left Ear: Tympanic membrane and  ear canal normal.       Mild pharyngeal erythema without exudates.   Cardiovascular: Normal rate and regular rhythm.   No murmur heard. Pulmonary/Chest: Effort normal and breath sounds normal. No respiratory distress. She has no wheezes. She has no rales. She exhibits no tenderness.  Musculoskeletal: She exhibits no edema.  Psychiatric: She has a normal mood and affect. Her behavior is normal. Judgment and thought content normal.          Assessment & Plan:

## 2011-05-04 NOTE — Telephone Encounter (Signed)
Patient is requesting CT results from earlier today. °

## 2011-05-04 NOTE — Assessment & Plan Note (Signed)
Due to lack of improvement, will plan to obtain CT sinus. If confirms sinusitis, will have her start levaquin.  In meantime, add flonase.

## 2011-05-04 NOTE — Patient Instructions (Signed)
Please complete your CT scan on the first floor.  Call if symptoms worsen or if no improvement in 2-3 days.

## 2011-05-04 NOTE — Assessment & Plan Note (Signed)
Will defer further work up to [employer].

## 2011-05-04 NOTE — Telephone Encounter (Signed)
Ct not yet read. Advised pt to start levaquin. I will call her in am with results. Pt verbalizes understanding.

## 2011-05-05 ENCOUNTER — Telehealth: Payer: Self-pay | Admitting: Family

## 2011-05-05 NOTE — Telephone Encounter (Signed)
Pt.notified

## 2011-05-05 NOTE — Telephone Encounter (Signed)
Left message for pt to return our call.   When she calls back, please let her know that her CT shows some chronic sinusitis.  I would like for her to add zyrtec D please, complete levaquin.  Call us if symptoms worsen, or if no improvement with these medications.

## 2011-06-03 ENCOUNTER — Encounter: Payer: Self-pay | Admitting: Family

## 2011-06-03 ENCOUNTER — Ambulatory Visit (INDEPENDENT_AMBULATORY_CARE_PROVIDER_SITE_OTHER): Payer: 59 | Admitting: Family

## 2011-06-03 VITALS — BP 110/72 | HR 93 | Temp 98.0°F | Resp 16 | Wt 194.0 lb

## 2011-06-03 DIAGNOSIS — J020 Streptococcal pharyngitis: Secondary | ICD-10-CM | POA: Insufficient documentation

## 2011-06-03 DIAGNOSIS — J029 Acute pharyngitis, unspecified: Secondary | ICD-10-CM

## 2011-06-03 LAB — POCT RAPID STREP A (OFFICE): Rapid Strep A Screen: POSITIVE — AB

## 2011-06-03 MED ORDER — PENICILLIN V POTASSIUM 500 MG PO TABS: 500.0000 mg | ORAL_TABLET | Freq: Three times a day (TID) | ORAL | Status: AC

## 2011-06-03 NOTE — Assessment & Plan Note (Signed)
Rapid strep +.  Will rx with penicillin x 10 days.

## 2011-06-03 NOTE — Progress Notes (Signed)
  Subjective:    Patient ID: Jocelyn Wyatt, female    DOB: 1970/04/27, 41 y.o.   MRN: [id number]  HPI  Ms.  Wyatt is a 41 yr old female who presents today with several URI complaints.  She reports that symptoms started on Friday 4/5.  Initially started with chest/nasal congestion.  Developed sore throat on 4/7.  Today she reports swollen glands. Has had some mild wheezing. Daughter is sick with similar symptoms.      Review of Systems See HPI  Past Medical History  Diagnosis Date  . Rectal bleeding   . Stress incontinence, female   . GERD (gastroesophageal reflux disease)   . Obesity   . Panic attack   . Oral candidiasis   . Bell's palsy     left    History   Social History  . Marital Status: Married    Spouse Name: N/A    Number of Children: N/A  . Years of Education: N/A   Occupational History  . Not on file.   Social History Main Topics  . Smoking status: Never Smoker   . Smokeless tobacco: Never Used  . Alcohol Use: No  . Drug Use: No  . Sexually Active: Not on file   Other Topics Concern  . Not on file   Social History Narrative  . No narrative on file    Past Surgical History  Procedure Date  . Cesarean section     2 x    Family History  Problem Relation Age of Onset  . Colon cancer Paternal Aunt   . Breast cancer Cousin     x 2  . Lymphoma    . Diabetes      No Known Allergies  Current Outpatient Prescriptions on File Prior to Visit  Medication Sig Dispense Refill  . fluticasone (FLONASE) 50 MCG/ACT nasal spray Place 2 sprays into the nose daily.  16 g  3    BP 110/72  Pulse 93  Temp(Src) 98 F (36.7 C) (Oral)  Resp 16  Wt 194 lb 0.6 oz (88.016 kg)  SpO2 98%  LMP 05/21/2011       Objective:   Physical Exam  Constitutional: She appears well-developed and well-nourished. No distress.  HENT:  Right Ear: Tympanic membrane and ear canal normal.  Left Ear: Tympanic membrane and ear canal normal.       R posterior pharynx noted  to have erythema, exudate.  Cardiovascular: Normal rate and regular rhythm.   No murmur heard. Pulmonary/Chest: Effort normal and breath sounds normal. No respiratory distress. She has no wheezes. She has no rales. She exhibits no tenderness.  Lymphadenopathy:    She has cervical adenopathy.  Skin: No rash noted.  Psychiatric: She has a normal mood and affect. Her behavior is normal. Judgment and thought content normal.          Assessment & Plan:

## 2011-06-03 NOTE — Patient Instructions (Signed)

## 2012-02-29 ENCOUNTER — Encounter: Payer: Self-pay | Admitting: Family

## 2012-02-29 ENCOUNTER — Ambulatory Visit (INDEPENDENT_AMBULATORY_CARE_PROVIDER_SITE_OTHER): Payer: 59 | Admitting: Family

## 2012-02-29 VITALS — BP 116/80 | HR 91 | Temp 98.2°F | Resp 16 | Ht 60.0 in | Wt 196.1 lb

## 2012-02-29 DIAGNOSIS — R05 Cough: Secondary | ICD-10-CM

## 2012-02-29 MED ORDER — ALBUTEROL SULFATE HFA 108 (90 BASE) MCG/ACT IN AERS: 2.0000 | INHALATION_SPRAY | Freq: Four times a day (QID) | RESPIRATORY_TRACT | Status: AC | PRN

## 2012-02-29 NOTE — Assessment & Plan Note (Signed)
Cough seems to be associated with her post nasal drip. Recommended that she switch to allegra-D for the next week or so, add flonase and PRN albuterol.  If symptoms worsen or if no improvement consider trial of PPI.

## 2012-02-29 NOTE — Progress Notes (Signed)
  Subjective:    Patient ID: Jocelyn Wyatt, female    DOB: 06-30-1970, 42 y.o.   MRN: [id number]  HPI  Jocelyn Wyatt is 42 yr old female who presents today with chief complaint of cough.  She reports that her cough has been present for about 3 weeks and is worse at night.  Reports + nasal drainage which is clear. She is not currently using flonase. Denies associated fever.   Using delsym and allegra without significant improvement.  She denies significant GERD symptoms or recent associated URI symptoms.   Review of Systems See HPI  Past Medical History  Diagnosis Date  . Rectal bleeding   . Stress incontinence, female   . GERD (gastroesophageal reflux disease)   . Obesity   . Panic attack   . Oral candidiasis   . Bell's palsy     left    History   Social History  . Marital Status: Married    Spouse Name: N/A    Number of Children: N/A  . Years of Education: N/A   Occupational History  . Not on file.   Social History Main Topics  . Smoking status: Never Smoker   . Smokeless tobacco: Never Used  . Alcohol Use: No  . Drug Use: No  . Sexually Active: Not on file   Other Topics Concern  . Not on file   Social History Narrative  . No narrative on file    Past Surgical History  Procedure Date  . Cesarean section     2 x    Family History  Problem Relation Age of Onset  . Colon cancer Paternal Aunt   . Breast cancer Cousin     x 2  . Lymphoma    . Diabetes      No Known Allergies  Current Outpatient Prescriptions on File Prior to Visit  Medication Sig Dispense Refill  . albuterol (PROVENTIL HFA;VENTOLIN HFA) 108 (90 BASE) MCG/ACT inhaler Inhale 2 puffs into the lungs every 6 (six) hours as needed for wheezing.  1 Inhaler  0  . fluticasone (FLONASE) 50 MCG/ACT nasal spray Place 2 sprays into the nose daily.  16 g  3    BP 116/80  Pulse 91  Temp 98.2 F (36.8 C) (Oral)  Resp 16  Ht 5' (1.524 m)  Wt 196 lb 1.3 oz (88.941 kg)  BMI 38.29 kg/m2  SpO2  99%       Objective:   Physical Exam  Constitutional: She appears well-developed and well-nourished. No distress.  HENT:  Head: Normocephalic and atraumatic.  Right Ear: Tympanic membrane and ear canal normal.  Left Ear: Tympanic membrane and ear canal normal.  Mouth/Throat: No posterior oropharyngeal erythema.  Cardiovascular: Normal rate and regular rhythm.   No murmur heard. Pulmonary/Chest: Effort normal and breath sounds normal. No respiratory distress. She has no wheezes. She has no rales. She exhibits no tenderness.          Assessment & Plan:

## 2012-02-29 NOTE — Patient Instructions (Addendum)
Call if symptoms worsen or if no improvement in 1 week.

## 2012-03-31 ENCOUNTER — Ambulatory Visit (INDEPENDENT_AMBULATORY_CARE_PROVIDER_SITE_OTHER): Payer: 59 | Admitting: Family

## 2012-03-31 ENCOUNTER — Encounter: Payer: Self-pay | Admitting: Family

## 2012-03-31 ENCOUNTER — Ambulatory Visit (HOSPITAL_BASED_OUTPATIENT_CLINIC_OR_DEPARTMENT_OTHER)
Admission: RE | Admit: 2012-03-31 | Discharge: 2012-03-31 | Disposition: A | Discharge status: Home / Self Care | Payer: 59 | Source: Ambulatory Visit | Attending: Family | Admitting: Family

## 2012-03-31 VITALS — BP 114/80 | HR 97 | Temp 97.8°F | Resp 16 | Wt 201.1 lb

## 2012-03-31 DIAGNOSIS — R05 Cough: Secondary | ICD-10-CM

## 2012-03-31 DIAGNOSIS — R059 Cough, unspecified: Secondary | ICD-10-CM | POA: Insufficient documentation

## 2012-03-31 MED ORDER — HYDROCOD POLST-CHLORPHEN POLST 10-8 MG/5ML PO LQCR: 5.0000 mL | Freq: Every evening | ORAL | Status: DC | PRN

## 2012-03-31 MED ORDER — AMOXICILLIN-POT CLAVULANATE 875-125 MG PO TABS: 1.0000 | ORAL_TABLET | Freq: Two times a day (BID) | ORAL | Status: DC

## 2012-03-31 MED ORDER — BUDESONIDE-FORMOTEROL FUMARATE 160-4.5 MCG/ACT IN AERO: 2.0000 | INHALATION_SPRAY | Freq: Two times a day (BID) | RESPIRATORY_TRACT | Status: AC

## 2012-03-31 MED ORDER — ESOMEPRAZOLE MAGNESIUM 40 MG PO CPDR: 40.0000 mg | DELAYED_RELEASE_CAPSULE | Freq: Every day | ORAL | Status: AC

## 2012-03-31 NOTE — Patient Instructions (Addendum)
Please complete your chest x ray on the first floor.  Call if symptoms worsen or if no improvement in 1 week Please schedule a follow up appointment in 1 month.

## 2012-03-31 NOTE — Assessment & Plan Note (Signed)
Etiology unclear- ? RAD,? GERD, ?Due to sinusitis. Will obtain CXR, add symbicort and nexium (samples provided).  Tussionex HS prn for short term.

## 2012-03-31 NOTE — Progress Notes (Signed)
  Subjective:    Patient ID: Jocelyn Wyatt, female    DOB: July 31, 1970, 42 y.o.   MRN: [id number]  HPI  Ms.  Wyatt is a 42 yr old female who presents today with chief complaint of cough. Productive at times of thick white sputum.  She is using albuterol mdi which helps the wheezing, but does not stop the cough. She is also using flonase.  Continues to have drainage without improvement. She also continues Careers adviser.      Review of Systems See HPI    Past Medical History  Diagnosis Date  . Rectal bleeding   . Stress incontinence, female   . GERD (gastroesophageal reflux disease)   . Obesity   . Panic attack   . Oral candidiasis   . Bell's palsy     left    History   Social History  . Marital Status: Married    Spouse Name: N/A    Number of Children: N/A  . Years of Education: N/A   Occupational History  . Not on file.   Social History Main Topics  . Smoking status: Never Smoker   . Smokeless tobacco: Never Used  . Alcohol Use: No  . Drug Use: No  . Sexually Active: Not on file   Other Topics Concern  . Not on file   Social History Narrative  . No narrative on file    Past Surgical History  Procedure Date  . Cesarean section     2 x    Family History  Problem Relation Age of Onset  . Colon cancer Paternal Aunt   . Breast cancer Cousin     x 2  . Lymphoma    . Diabetes      No Known Allergies  Current Outpatient Prescriptions on File Prior to Visit  Medication Sig Dispense Refill  . albuterol (PROVENTIL HFA;VENTOLIN HFA) 108 (90 BASE) MCG/ACT inhaler Inhale 2 puffs into the lungs every 6 (six) hours as needed for wheezing.  1 Inhaler  0  . fexofenadine (ALLEGRA) 180 MG tablet Take 180 mg by mouth daily.      . fluticasone (FLONASE) 50 MCG/ACT nasal spray Place 2 sprays into the nose daily.  16 g  3    BP 114/80  Pulse 97  Temp 97.8 F (36.6 C) (Oral)  Resp 16  Wt 201 lb 1.3 oz (91.209 kg)  SpO2 99%    Objective:   Physical Exam   Constitutional: She is oriented to person, place, and time. She appears well-developed and well-nourished. No distress.  HENT:  Head: Normocephalic and atraumatic.       No sinus tenderness to palpation  Cardiovascular: Normal rate and regular rhythm.   No murmur heard. Pulmonary/Chest: Breath sounds normal. No respiratory distress. She has no wheezes. She has no rales. She exhibits no tenderness.  Musculoskeletal: She exhibits no edema.  Neurological: She is alert and oriented to person, place, and time.  Skin: Skin is warm and dry.  Psychiatric: She has a normal mood and affect. Her behavior is normal. Judgment and thought content normal.          Assessment & Plan:

## 2012-04-09 ENCOUNTER — Other Ambulatory Visit: Payer: Self-pay

## 2012-12-29 ENCOUNTER — Other Ambulatory Visit: Payer: Self-pay

## 2013-05-18 ENCOUNTER — Encounter: Payer: Self-pay | Admitting: Physician Assistant

## 2013-05-18 ENCOUNTER — Ambulatory Visit (INDEPENDENT_AMBULATORY_CARE_PROVIDER_SITE_OTHER): Payer: 59 | Admitting: Physician Assistant

## 2013-05-18 VITALS — BP 118/82 | HR 96 | Temp 98.1°F | Resp 16 | Wt 205.0 lb

## 2013-05-18 DIAGNOSIS — J209 Acute bronchitis, unspecified: Secondary | ICD-10-CM | POA: Insufficient documentation

## 2013-05-18 MED ORDER — AZITHROMYCIN 250 MG PO TABS: ORAL_TABLET | ORAL | Status: AC

## 2013-05-18 NOTE — Progress Notes (Signed)
Patient presents to clinic today c/o sinus pressure, fatigue, chest congestion and cough that has been present for 3 weeks.  Cough sometime productive.  Denies fever, shortness of breath, wheezing or pleuritic chest pain.  Denies recent travel or sick contact.  Past Medical History  Diagnosis Date  . Rectal bleeding   . Stress incontinence, female   . GERD (gastroesophageal reflux disease)   . Obesity   . Panic attack   . Oral candidiasis   . Bell's palsy     left    Current Outpatient Prescriptions on File Prior to Visit  Medication Sig Dispense Refill  . fexofenadine (ALLEGRA) 180 MG tablet Take 180 mg by mouth daily.      Marland Kitchen. albuterol (PROVENTIL HFA;VENTOLIN HFA) 108 (90 BASE) MCG/ACT inhaler Inhale 2 puffs into the lungs every 6 (six) hours as needed for wheezing.  1 Inhaler  0  . budesonide-formoterol (SYMBICORT) 160-4.5 MCG/ACT inhaler Inhale 2 puffs into the lungs 2 (two) times daily.  1 Inhaler  0  . esomeprazole (NEXIUM) 40 MG capsule Take 1 capsule (40 mg total) by mouth daily.  30 capsule  0   No current facility-administered medications on file prior to visit.    No Known Allergies  Family History  Problem Relation Age of Onset  . Colon cancer Paternal Aunt   . Breast cancer Cousin     x 2  . Lymphoma    . Diabetes      History   Social History  . Marital Status: Married    Spouse Name: N/A    Number of Children: N/A  . Years of Education: N/A   Social History Main Topics  . Smoking status: Never Smoker   . Smokeless tobacco: Never Used  . Alcohol Use: No  . Drug Use: No  . Sexual Activity: None   Other Topics Concern  . None   Social History Narrative  . None   Review of Systems - See HPI.  All other ROS are negative.  BP 118/82  Pulse 96  Temp(Src) 98.1 F (36.7 C) (Oral)  Resp 16  Wt 205 lb (92.987 kg)  SpO2 98%  Physical Exam  Vitals reviewed. Constitutional: She is oriented to person, place, and time and well-developed,  well-nourished, and in no distress.  HENT:  Head: Normocephalic and atraumatic.  Right Ear: External ear normal.  Left Ear: External ear normal.  Nose: Nose normal.  Mouth/Throat: Oropharynx is clear and moist. No oropharyngeal exudate.  TM within normal limits bilaterally.  No TTP of sinuses noted on exam.  Eyes: Conjunctivae are normal. Pupils are equal, round, and reactive to light.  Neck: Neck supple.  Cardiovascular: Normal rate, regular rhythm, normal heart sounds and intact distal pulses.   Pulmonary/Chest: Effort normal and breath sounds normal. No respiratory distress. She has no wheezes. She has no rales. She exhibits no tenderness.  Lymphadenopathy:    She has no cervical adenopathy.  Neurological: She is alert and oriented to person, place, and time.  Skin: Skin is warm and dry. No rash noted.  Psychiatric: Affect normal.   Assessment/Plan: Acute bronchitis Rx Azithromycin. Increase fluids. Rest. Mucinex.  Delsym for cough.  Humidifier in bedroom.

## 2013-05-18 NOTE — Progress Notes (Signed)
Pre-visit discussion using our clinic review tool. No additional management support is needed unless otherwise documented below in the visit note.  

## 2013-05-18 NOTE — Assessment & Plan Note (Signed)
Rx Azithromycin. Increase fluids. Rest. Mucinex.  Delsym for cough.  Humidifier in bedroom.

## 2013-05-18 NOTE — Patient Instructions (Signed)
Please take Azithromycin as prescribed.  Increase fluids.  Take Mucinex as needed.  Continue Delsym.  Place a humidifier in the bedroom.   Call or return to clinic if symptoms are not improving.  For Acid Reflux symptoms, please begin taking Prilosec OTC once daily.  Avoid trigger foods.  Elevate the head of your bed.  Diet for Gastroesophageal Reflux Disease, Adult Reflux (acid reflux) is when acid from your stomach flows up into the esophagus. When acid comes in contact with the esophagus, the acid causes irritation and soreness (inflammation) in the esophagus. When reflux happens often or so severely that it causes damage to the esophagus, it is called gastroesophageal reflux disease (GERD). Nutrition therapy can help ease the discomfort of GERD. FOODS OR DRINKS TO AVOID OR LIMIT  Smoking or chewing tobacco. Nicotine is one of the most potent stimulants to acid production in the gastrointestinal tract.  Caffeinated and decaffeinated coffee and black tea.  Regular or low-calorie carbonated beverages or energy drinks (caffeine-free carbonated beverages are allowed).   Strong spices, such as black pepper, white pepper, red pepper, cayenne, curry powder, and chili powder.  Peppermint or spearmint.  Chocolate.  High-fat foods, including meats and fried foods. Extra added fats including oils, butter, salad dressings, and nuts. Limit these to less than 8 tsp per day.  Fruits and vegetables if they are not tolerated, such as citrus fruits or tomatoes.  Alcohol.  Any food that seems to aggravate your condition. If you have questions regarding your diet, call your caregiver or a registered dietitian. OTHER THINGS THAT MAY HELP GERD INCLUDE:   Eating your meals slowly, in a relaxed setting.  Eating 5 to 6 small meals per day instead of 3 large meals.  Eliminating food for a period of time if it causes distress.  Not lying down until 3 hours after eating a meal.  Keeping the head of  your bed raised 6 to 9 inches (15 to 23 cm) by using a foam wedge or blocks under the legs of the bed. Lying flat may make symptoms worse.  Being physically active. Weight loss may be helpful in reducing reflux in overweight or obese adults.  Wear loose fitting clothing EXAMPLE MEAL PLAN This meal plan is approximately 2,000 calories based on https://www.[place].org/ChooseMyPlate.gov meal planning guidelines. Breakfast   cup cooked oatmeal.  1 cup strawberries.  1 cup low-fat milk.  1 oz almonds. Snack  1 cup cucumber slices.  6 oz yogurt (made from low-fat or fat-free milk). Lunch  2 slice whole-wheat bread.  2 oz sliced [place].  2 tsp mayonnaise.  1 cup blueberries.  1 cup snap peas. Snack  6 whole-wheat crackers.  1 oz string cheese. Dinner   cup brown rice.  1 cup mixed veggies.  1 tsp olive oil.  3 oz grilled fish. Document Released: 02/09/2005 Document Revised: 05/04/2011 Document Reviewed: 12/26/2010 [facility] Patient Information 2014 [employer], [place].

## 2013-06-09 IMAGING — CR DG CHEST 2V
2 series · 2 of 2 positions shown · non-contrast
Comparison: December 21, 2008

CLINICAL DATA: Cough

CHEST - 2 VIEW

[w chest pa]
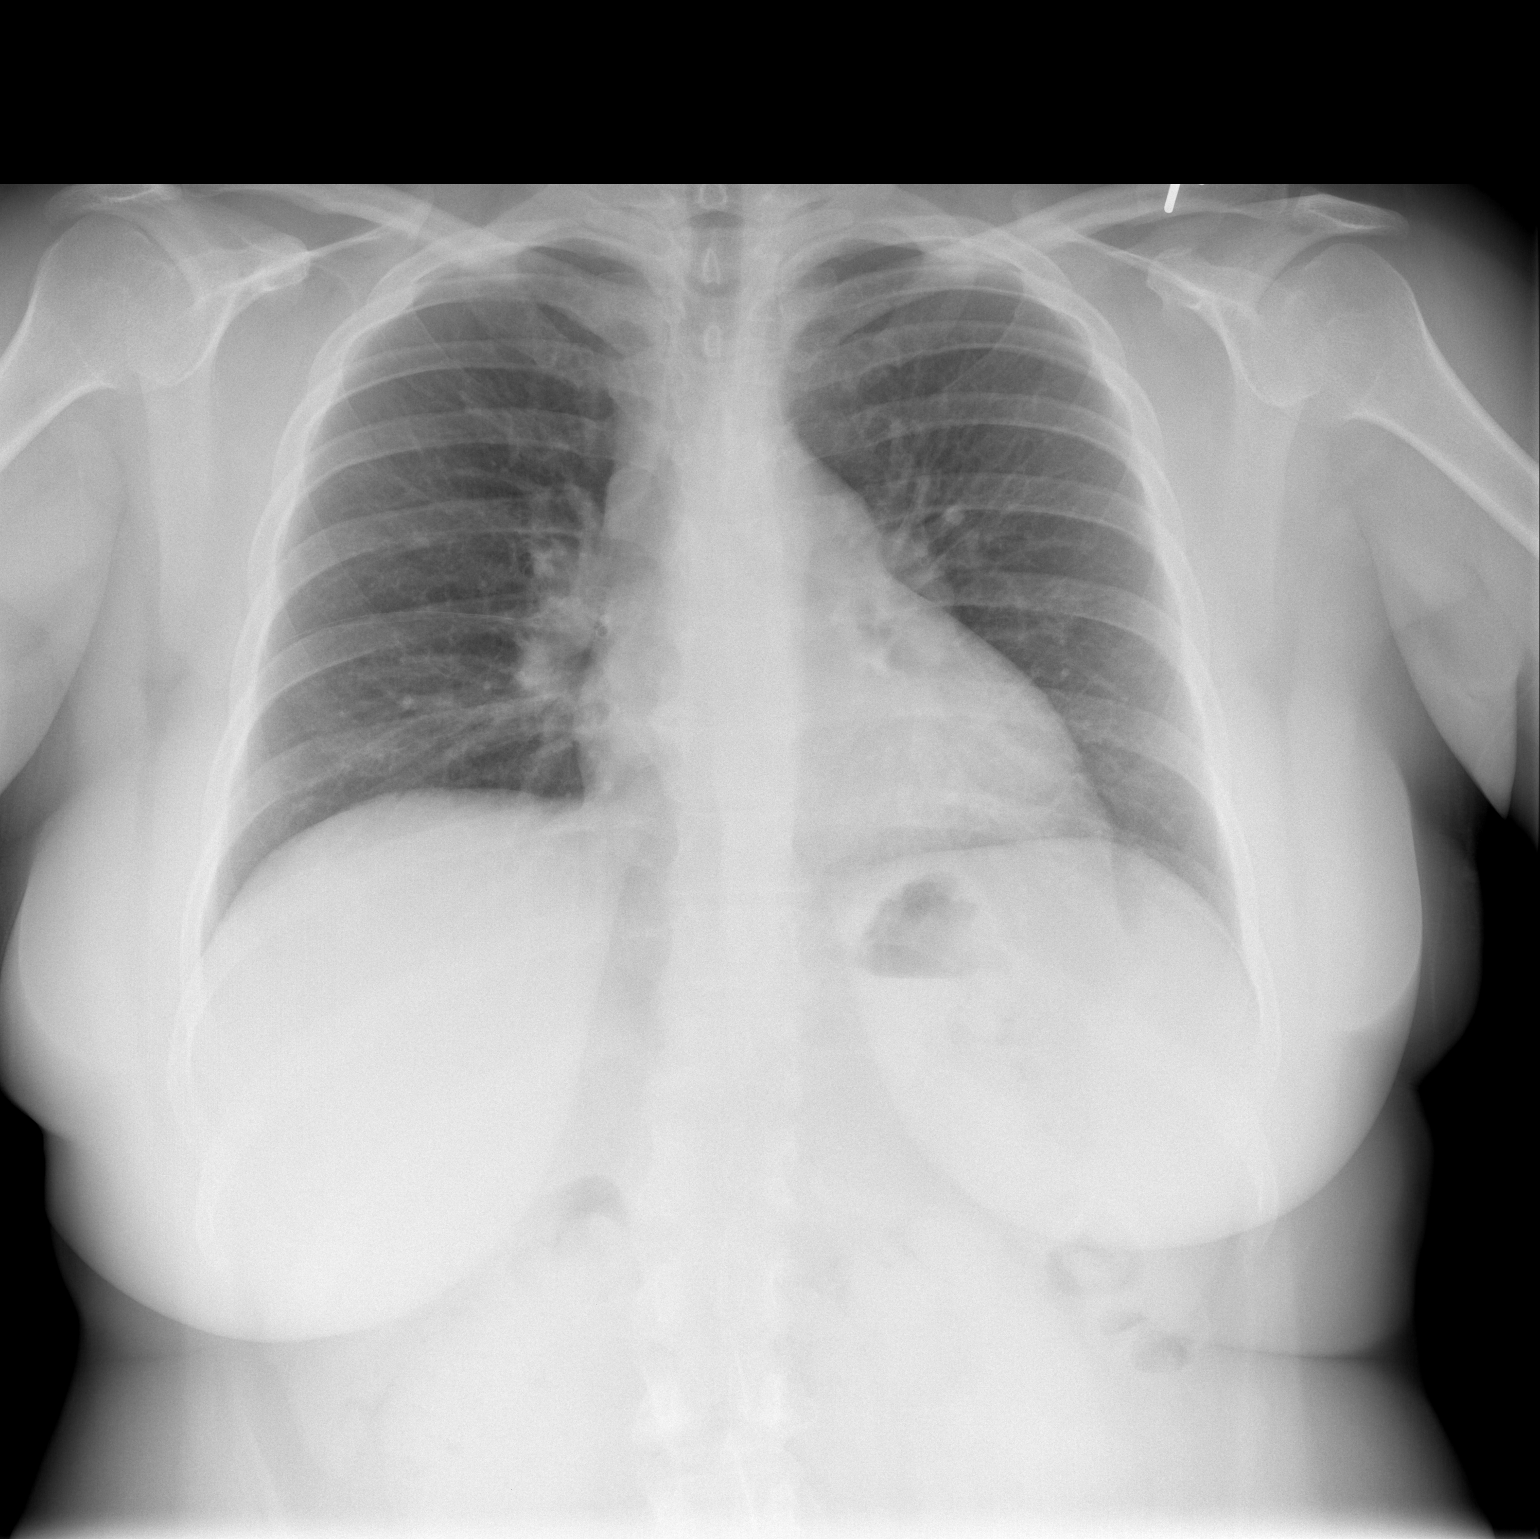

[w chest lat]
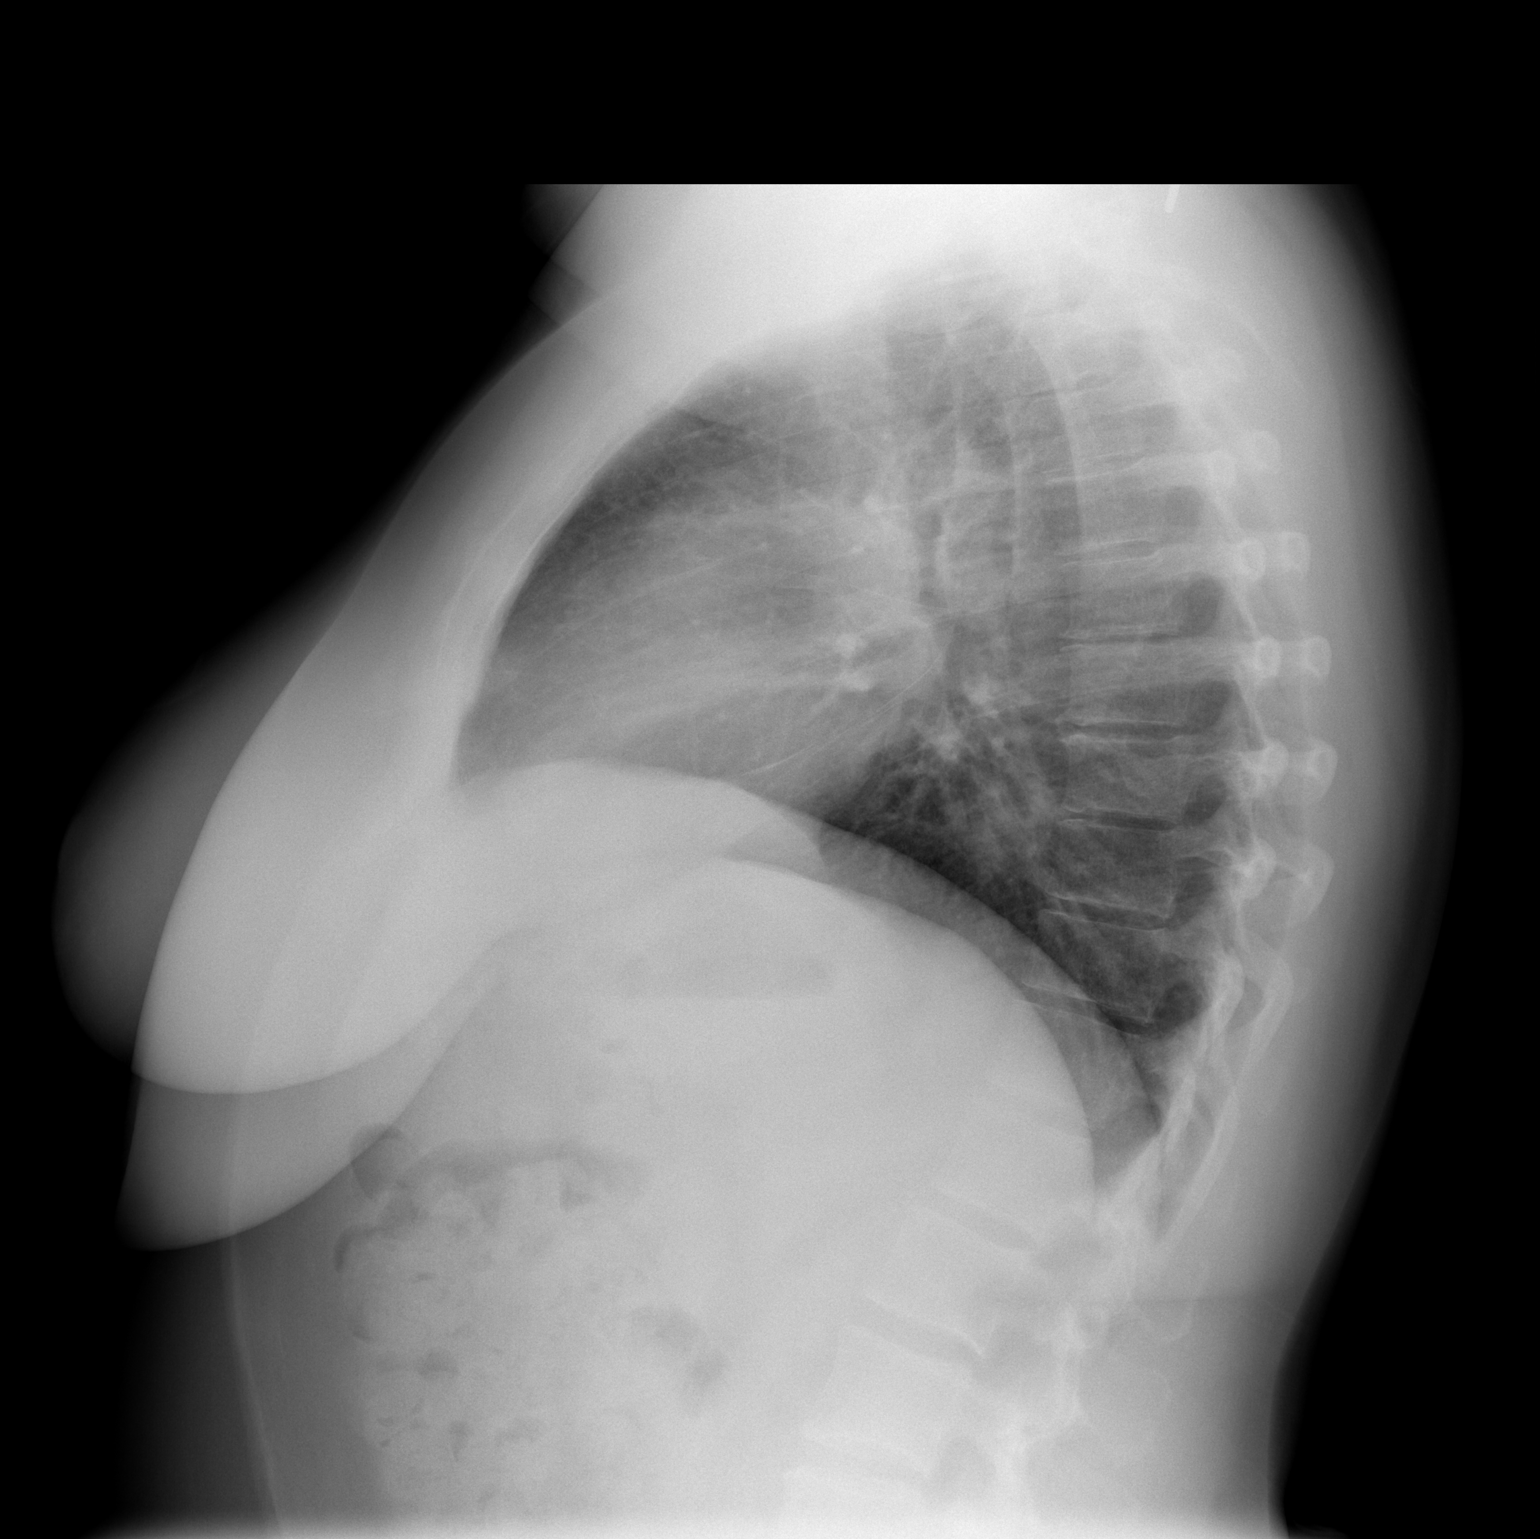

[2 of 2 positions shown; findings below may reference images not displayed]

FINDINGS: Lungs clear.  Heart size and pulmonary vascularity are
normal.  No adenopathy.  No bone lesions.
IMPRESSION: No abnormality noted.
